# Patient Record
Sex: Female | Born: 1935 | Race: Black or African American | Hispanic: No | Marital: Married | Smoking: Never smoker
Health system: Southern US, Community
[De-identification: ages and names within clinical notes are randomized; demographics above are authoritative.]

## PROBLEM LIST (undated history)

## (undated) DIAGNOSIS — J449 Chronic obstructive pulmonary disease, unspecified: Secondary | ICD-10-CM

## (undated) DIAGNOSIS — E785 Hyperlipidemia, unspecified: Secondary | ICD-10-CM

## (undated) DIAGNOSIS — N289 Disorder of kidney and ureter, unspecified: Secondary | ICD-10-CM

## (undated) DIAGNOSIS — I10 Essential (primary) hypertension: Secondary | ICD-10-CM

## (undated) DIAGNOSIS — E119 Type 2 diabetes mellitus without complications: Secondary | ICD-10-CM

---

## 2004-02-21 ENCOUNTER — Ambulatory Visit: Payer: Self-pay

## 2004-06-20 ENCOUNTER — Ambulatory Visit: Payer: Self-pay | Admitting: Internal Medicine

## 2005-06-26 ENCOUNTER — Ambulatory Visit: Payer: Self-pay | Admitting: Internal Medicine

## 2006-07-02 ENCOUNTER — Ambulatory Visit: Payer: Self-pay | Admitting: Internal Medicine

## 2006-10-18 ENCOUNTER — Emergency Department: Payer: Self-pay | Admitting: Emergency Medicine

## 2006-10-18 ENCOUNTER — Other Ambulatory Visit: Payer: Self-pay

## 2006-10-24 ENCOUNTER — Emergency Department: Payer: Self-pay | Admitting: Emergency Medicine

## 2006-12-22 ENCOUNTER — Emergency Department: Payer: Self-pay | Admitting: Emergency Medicine

## 2007-01-11 ENCOUNTER — Ambulatory Visit: Payer: Self-pay | Admitting: Internal Medicine

## 2007-04-14 ENCOUNTER — Ambulatory Visit: Payer: Self-pay | Admitting: Nephrology

## 2007-05-28 ENCOUNTER — Ambulatory Visit: Payer: Self-pay | Admitting: Nephrology

## 2007-07-28 ENCOUNTER — Ambulatory Visit: Payer: Self-pay | Admitting: Internal Medicine

## 2007-08-26 ENCOUNTER — Ambulatory Visit: Payer: Self-pay | Admitting: Urology

## 2007-09-09 ENCOUNTER — Ambulatory Visit: Payer: Self-pay | Admitting: Nephrology

## 2007-09-17 ENCOUNTER — Ambulatory Visit: Payer: Self-pay | Admitting: Gastroenterology

## 2008-08-01 ENCOUNTER — Ambulatory Visit: Payer: Self-pay | Admitting: Internal Medicine

## 2009-04-04 ENCOUNTER — Ambulatory Visit: Payer: Self-pay | Admitting: Ophthalmology

## 2009-04-16 ENCOUNTER — Ambulatory Visit: Payer: Self-pay | Admitting: Ophthalmology

## 2009-07-04 ENCOUNTER — Ambulatory Visit: Payer: Self-pay | Admitting: Ophthalmology

## 2009-07-16 ENCOUNTER — Ambulatory Visit: Payer: Self-pay | Admitting: Ophthalmology

## 2009-08-02 ENCOUNTER — Ambulatory Visit: Payer: Self-pay | Admitting: Internal Medicine

## 2010-09-03 ENCOUNTER — Ambulatory Visit: Payer: Self-pay | Admitting: Internal Medicine

## 2011-05-09 ENCOUNTER — Ambulatory Visit: Payer: Self-pay | Admitting: Gastroenterology

## 2011-09-24 ENCOUNTER — Ambulatory Visit: Payer: Self-pay | Admitting: Internal Medicine

## 2012-09-24 ENCOUNTER — Ambulatory Visit: Payer: Self-pay | Admitting: Internal Medicine

## 2013-10-12 ENCOUNTER — Ambulatory Visit: Payer: Self-pay | Admitting: Internal Medicine

## 2014-06-27 ENCOUNTER — Ambulatory Visit: Payer: Self-pay | Admitting: Gastroenterology

## 2014-08-21 LAB — SURGICAL PATHOLOGY

## 2014-10-23 ENCOUNTER — Other Ambulatory Visit: Payer: Self-pay | Admitting: Internal Medicine

## 2014-10-23 DIAGNOSIS — Z1231 Encounter for screening mammogram for malignant neoplasm of breast: Secondary | ICD-10-CM

## 2014-10-25 ENCOUNTER — Other Ambulatory Visit: Payer: Self-pay | Admitting: Internal Medicine

## 2014-10-25 ENCOUNTER — Ambulatory Visit
Admission: RE | Admit: 2014-10-25 | Discharge: 2014-10-25 | Disposition: A | Discharge status: Home / Self Care | Payer: Medicare Other | Source: Ambulatory Visit | Attending: Internal Medicine | Admitting: Internal Medicine

## 2014-10-25 DIAGNOSIS — Z1231 Encounter for screening mammogram for malignant neoplasm of breast: Secondary | ICD-10-CM | POA: Diagnosis present

## 2015-03-31 ENCOUNTER — Emergency Department: Payer: Medicare Other

## 2015-03-31 ENCOUNTER — Emergency Department
Admission: EM | Admit: 2015-03-31 | Discharge: 2015-03-31 | Disposition: A | Discharge status: Home / Self Care | Payer: Medicare Other | Attending: Emergency Medicine | Admitting: Emergency Medicine

## 2015-03-31 ENCOUNTER — Encounter: Payer: Self-pay | Admitting: Emergency Medicine

## 2015-03-31 DIAGNOSIS — E119 Type 2 diabetes mellitus without complications: Secondary | ICD-10-CM | POA: Insufficient documentation

## 2015-03-31 DIAGNOSIS — J441 Chronic obstructive pulmonary disease with (acute) exacerbation: Secondary | ICD-10-CM | POA: Diagnosis not present

## 2015-03-31 DIAGNOSIS — N189 Chronic kidney disease, unspecified: Secondary | ICD-10-CM

## 2015-03-31 DIAGNOSIS — R0602 Shortness of breath: Secondary | ICD-10-CM

## 2015-03-31 DIAGNOSIS — I129 Hypertensive chronic kidney disease with stage 1 through stage 4 chronic kidney disease, or unspecified chronic kidney disease: Secondary | ICD-10-CM | POA: Insufficient documentation

## 2015-03-31 HISTORY — DX: Chronic obstructive pulmonary disease, unspecified: J44.9

## 2015-03-31 HISTORY — DX: Hyperlipidemia, unspecified: E78.5

## 2015-03-31 HISTORY — DX: Type 2 diabetes mellitus without complications: E11.9

## 2015-03-31 HISTORY — DX: Disorder of kidney and ureter, unspecified: N28.9

## 2015-03-31 HISTORY — DX: Essential (primary) hypertension: I10

## 2015-03-31 LAB — BASIC METABOLIC PANEL
Anion gap: 11 (ref 5–15)
BUN: 76 mg/dL — ABNORMAL HIGH (ref 6–20)
CO2: 24 mmol/L (ref 22–32)
Calcium: 9.6 mg/dL (ref 8.9–10.3)
Chloride: 104 mmol/L (ref 101–111)
Creatinine, Ser: 2.15 mg/dL — ABNORMAL HIGH (ref 0.44–1.00)
GFR calc Af Amer: 24 mL/min — ABNORMAL LOW (ref >60)
GFR calc non Af Amer: 21 mL/min — ABNORMAL LOW (ref >60)
Glucose, Bld: 99 mg/dL (ref 65–99)
Potassium: 4.2 mmol/L (ref 3.5–5.1)
Sodium: 139 mmol/L (ref 135–145)

## 2015-03-31 LAB — CBC WITH DIFFERENTIAL/PLATELET
Basophils Absolute: 0 10*3/uL (ref 0–0.1)
Basophils Relative: 1 %
EOS ABS: 0 10*3/uL (ref 0–0.7)
Eosinophils Relative: 1 %
HEMATOCRIT: 33.3 % — AB (ref 35.0–47.0)
HEMOGLOBIN: 10.9 g/dL — AB (ref 12.0–16.0)
Lymphocytes Relative: 23 %
Lymphs Abs: 1.2 10*3/uL (ref 1.0–3.6)
MCH: 26.6 pg (ref 26.0–34.0)
MCHC: 32.7 g/dL (ref 32.0–36.0)
MCV: 81.4 fL (ref 80.0–100.0)
Monocytes Absolute: 0.5 10*3/uL (ref 0.2–0.9)
Monocytes Relative: 9 %
NEUTROS ABS: 3.4 10*3/uL (ref 1.4–6.5)
NEUTROS PCT: 66 %
Platelets: 227 10*3/uL (ref 150–440)
RBC: 4.1 MIL/uL (ref 3.80–5.20)
RDW: 14.9 % — ABNORMAL HIGH (ref 11.5–14.5)
WBC: 5.1 10*3/uL (ref 3.6–11.0)

## 2015-03-31 LAB — TROPONIN I: Troponin I: <0.03 ng/mL (ref <0.031)

## 2015-03-31 MED ORDER — SODIUM CHLORIDE 0.9 % IV BOLUS (SEPSIS)
1000.0000 mL | Freq: Once | INTRAVENOUS | Status: AC
  Administered 2015-03-31: 1000 mL via INTRAVENOUS

## 2015-03-31 NOTE — Discharge Instructions (Signed)
Your chest x-ray looked okay in your blood tests look about the same as they usually are. You appeared well in the emergency department. Follow-up with Dr. Judithann SheenSparks this week. This was discussed with him as well. Return the emergency department if you have worsening shortness of breath or other urgent concerns.  Shortness of Breath Shortness of breath means you have trouble breathing. It could also mean that you have a medical problem. You should get immediate medical care for shortness of breath. CAUSES   Not enough oxygen in the air such as with high altitudes or a smoke-filled room.  Certain lung diseases, infections, or problems.  Heart disease or conditions, such as angina or heart failure.  Low red blood cells (anemia).  Poor physical fitness, which can cause shortness of breath when you exercise.  Chest or back injuries or stiffness.  Being overweight.  Smoking.  Anxiety, which can make you feel like you are not getting enough air. DIAGNOSIS  Serious medical problems can often be found during your physical exam. Tests may also be done to determine why you are having shortness of breath. Tests may include:  Chest X-rays.  Lung function tests.  Blood tests.  An electrocardiogram (ECG).  An ambulatory electrocardiogram. An ambulatory ECG records your heartbeat patterns over a 24-hour period.  Exercise testing.  A transthoracic echocardiogram (TTE). During echocardiography, sound waves are used to evaluate how blood flows through your heart.  A transesophageal echocardiogram (TEE).  Imaging scans. Your health care provider may not be able to find a cause for your shortness of breath after your exam. In this case, it is important to have a follow-up exam with your health care provider as directed.  TREATMENT  Treatment for shortness of breath depends on the cause of your symptoms and can vary greatly. HOME CARE INSTRUCTIONS   Do not smoke. Smoking is a common cause  of shortness of breath. If you smoke, ask for help to quit.  Avoid being around chemicals or things that may bother your breathing, such as paint fumes and dust.  Rest as needed. Slowly resume your usual activities.  If medicines were prescribed, take them as directed for the full length of time directed. This includes oxygen and any inhaled medicines.  Keep all follow-up appointments as directed by your health care provider. SEEK MEDICAL CARE IF:   Your condition does not improve in the time expected.  You have a hard time doing your normal activities even with rest.  You have any new symptoms. SEEK IMMEDIATE MEDICAL CARE IF:   Your shortness of breath gets worse.  You feel light-headed, faint, or develop a cough not controlled with medicines.  You start coughing up blood.  You have pain with breathing.  You have chest pain or pain in your arms, shoulders, or abdomen.  You have a fever.  You are unable to walk up stairs or exercise the way you normally do. MAKE SURE YOU:  Understand these instructions.  Will watch your condition.  Will get help right away if you are not doing well or get worse.   This information is not intended to replace advice given to you by your health care provider. Make sure you discuss any questions you have with your health care provider.   Document Released: 01/07/2001 Document Revised: 04/19/2013 Document Reviewed: 06/30/2011 Elsevier Interactive Patient Education [employer]2016 Elsevier Inc.

## 2015-03-31 NOTE — ED Notes (Signed)
Pt to ed with c/o sob increasing over the last 2 weeks.  Pt denies chest pain. Pt states sob is worse with activity.

## 2015-03-31 NOTE — ED Provider Notes (Signed)
[facility] Emergency Department Provider Note  ____________________________________________  Time seen: 1415  I have reviewed the triage vital signs and the nursing notes.  History by:  Patient  HISTORY  Chief Complaint Shortness of Breath     HPI Debbie Patrick is a 81 y.o. female who presents to the emergency department with complaint of shortness of breath. She tells me she first noticed this about 3 weeks ago when she went to the tract to do her usual walk. She felt that she was a little short winded and stopped her walker early. This reoccurred over the next 2 days. Since then, she reports she's had short episodes with some shortness of breath, although she does not seem to be alarmed by it or particular concerned. She is a little bit tangential with the history and she does not describe any specific acute event other than the episodes at the track approximately 3 weeks ago. She does report that she had a little bit of shortness of breath earlier today and she told this to her daughter-in-law on the telephone. The daughter-in-law said she needed to go to the hospital and the daughter-in-law also told this to the patient's husband. They are now here for evaluation, in large part due to the daughter-in-law's concern.  The patient denies any chest pain. As noted above, she is vague about her symptoms and when they're at their worst. She reports she had approximately a 20 minute episode earlier today of some shortness of breath, though she does not appear to be alarmed or concerned with that.   Past Medical History  Diagnosis Date  . Diabetes mellitus without complication (HCC)   . Hypertension   . Renal disorder   . COPD (chronic obstructive pulmonary disease) (HCC)   . Hyperlipidemia     There are no active problems to display for this patient.   History reviewed. No pertinent past surgical history.  No current outpatient prescriptions on  file.  Allergies Review of patient's allergies indicates no known allergies.  History reviewed. No pertinent family history.  Social History Social History  Substance Use Topics  . Smoking status: Never Smoker   . Smokeless tobacco: None  . Alcohol Use: No    Review of Systems  Constitutional: Negative for fever/chills. ENT: Negative for congestion. Cardiovascular: Negative for chest pain. Respiratory: Patient reports some shortness of breath, didn't abate manner. See history of present illness Gastrointestinal: Negative for abdominal pain, vomiting and diarrhea. Genitourinary: Negative for dysuria. Musculoskeletal: No back pain. Skin: Negative for rash. Neurological: Negative for headache or focal weakness   10-point ROS otherwise negative.  ____________________________________________   PHYSICAL EXAM:  VITAL SIGNS: ED Triage Vitals  Enc Vitals Group     BP 03/31/15 1357 200/64 mmHg     Pulse Rate 03/31/15 1357 80     Resp 03/31/15 1357 20     Temp 03/31/15 1357 98 F (36.7 C)     Temp Source 03/31/15 1357 Oral     SpO2 03/31/15 1357 97 %     Weight 03/31/15 1357 104 lb (47.174 kg)     Height 03/31/15 1357 5' (1.524 m)     Head Cir --      Peak Flow --      Pain Score 03/31/15 1358 0     Pain Loc --      Pain Edu? --      Excl. in GC? --     Constitutional: Alert and communicative. Very pleasant  and cooperative. Little vague on details of her symptoms, but appears to have intact cognition. No distress. ENT   Head: Normocephalic and atraumatic.   Nose: No congestion/rhinnorhea.  Cardiovascular: Normal rate, regular rhythm, no murmur noted Respiratory:  Normal respiratory effort, no tachypnea.    Breath sounds are clear and equal bilaterally.  Gastrointestinal: Soft, no distention. Nontender Back: No muscle spasm, no tenderness, no CVA tenderness. Musculoskeletal: No deformity noted. Nontender with normal range of motion in all extremities.  No  noted edema. Neurologic:  Communicative. Normal appearing spontaneous movement in all 4 extremities. No gross focal neurologic deficits are appreciated.  Skin:  Skin is warm, dry. No rash noted. Psychiatric: Mood and affect are normal. Speech and behavior are normal.  ____________________________________________    LABS (pertinent positives/negatives)  Labs Reviewed  BASIC METABOLIC PANEL - Abnormal; Notable for the following:    BUN 76 (*)    Creatinine, Ser 2.15 (*)    GFR calc non Af Amer 21 (*)    GFR calc Af Amer 24 (*)    All other components within normal limits  CBC WITH DIFFERENTIAL/PLATELET - Abnormal; Notable for the following:    Hemoglobin 10.9 (*)    HCT 33.3 (*)    RDW 14.9 (*)    All other components within normal limits  TROPONIN I     ____________________________________________   EKG  ED ECG REPORT I, Victorhugo Preis W, the attending physician, personally viewed and interpreted this ECG.   Date: 03/31/2015  EKG Time: 1416  Rate: 72  Rhythm:  Normal sinus rhythm   Axis: Normal  Intervals: Normal  ST&T Change: None noted   ____________________________________________    RADIOLOGY  Chest x-ray:  IMPRESSION: 1. Cardiomegaly, mild to moderate in degree. 2. Lungs are clear and there is no evidence of acute cardiopulmonary abnormality. 3. Scoliosis, moderate to severe in degree.   ____________________________________________   PROCEDURES   ____________________________________________   INITIAL IMPRESSION / ASSESSMENT AND PLAN / ED COURSE  Pertinent labs & imaging results that were available during my care of the patient were reviewed by me and considered in my medical decision making (see chart for details).  Pleasant, well-appearing, 81 year old female in no acute distress. She reports having some increased shortness of breath when she goes for her daily walk. This is limited her from pursuing this, however she has not had any other  limitation in daily activities. She reports she stays busy around the house cleaning and did some ironing this morning without having shortness of breath, though she does describe a 20 minute episode while she was sitting down. She denies any chest pain. Her exam appears benign. We will get a chest x-ray and check a blood count and EKG to be sure that this 81 year old female is doing well.  ----------------------------------------- 4:26 PM on 03/31/2015 -----------------------------------------  The patient's chest x-ray is clear. Her blood count is 10.9, but this is consistent with previous levels. Her BUN is notably elevated at 76, but again this is not abnormal for her prior lab values.  Given the patient's overall well appearance and baseline lab values, we will discharge her home and advised her to follow with her primary physician. I've asked her to return to the emergency department if she has worsening shortness of breath or any urgent concerns.  ____________________________________________   FINAL CLINICAL IMPRESSION(S) / ED DIAGNOSES  Final diagnoses:  Shortness of breath  Chronic renal insufficiency, unspecified stage      Darien Ramusavid W Jeramy Dimmick, MD  03/31/15 1634 

## 2015-04-04 ENCOUNTER — Other Ambulatory Visit: Payer: Self-pay | Admitting: Internal Medicine

## 2015-04-04 DIAGNOSIS — R05 Cough: Secondary | ICD-10-CM

## 2015-04-04 DIAGNOSIS — R0609 Other forms of dyspnea: Secondary | ICD-10-CM

## 2015-04-04 DIAGNOSIS — R059 Cough, unspecified: Secondary | ICD-10-CM

## 2015-04-13 ENCOUNTER — Ambulatory Visit
Admission: RE | Admit: 2015-04-13 | Discharge: 2015-04-13 | Disposition: A | Discharge status: Home / Self Care | Payer: Medicare Other | Source: Ambulatory Visit | Attending: Internal Medicine | Admitting: Internal Medicine

## 2015-04-13 DIAGNOSIS — N261 Atrophy of kidney (terminal): Secondary | ICD-10-CM | POA: Diagnosis not present

## 2015-04-13 DIAGNOSIS — R0609 Other forms of dyspnea: Secondary | ICD-10-CM

## 2015-04-13 DIAGNOSIS — R05 Cough: Secondary | ICD-10-CM | POA: Diagnosis present

## 2015-04-13 DIAGNOSIS — R918 Other nonspecific abnormal finding of lung field: Secondary | ICD-10-CM | POA: Diagnosis not present

## 2015-04-13 DIAGNOSIS — R059 Cough, unspecified: Secondary | ICD-10-CM

## 2015-04-13 DIAGNOSIS — N281 Cyst of kidney, acquired: Secondary | ICD-10-CM | POA: Diagnosis not present

## 2015-08-22 ENCOUNTER — Other Ambulatory Visit: Payer: Self-pay | Admitting: Internal Medicine

## 2015-08-22 DIAGNOSIS — R4182 Altered mental status, unspecified: Secondary | ICD-10-CM

## 2015-08-29 ENCOUNTER — Ambulatory Visit
Admission: RE | Admit: 2015-08-29 | Discharge: 2015-08-29 | Disposition: A | Discharge status: Home / Self Care | Payer: Medicare Other | Source: Ambulatory Visit | Attending: Internal Medicine | Admitting: Internal Medicine

## 2015-08-29 DIAGNOSIS — I6782 Cerebral ischemia: Secondary | ICD-10-CM | POA: Diagnosis not present

## 2015-08-29 DIAGNOSIS — J349 Unspecified disorder of nose and nasal sinuses: Secondary | ICD-10-CM | POA: Insufficient documentation

## 2015-08-29 DIAGNOSIS — R4182 Altered mental status, unspecified: Secondary | ICD-10-CM | POA: Diagnosis present

## 2016-01-30 ENCOUNTER — Inpatient Hospital Stay
Admission: EM | Admit: 2016-01-30 | Discharge: 2016-02-27 | DRG: 082 | Disposition: E | Payer: Medicare Other | Attending: Internal Medicine | Admitting: Internal Medicine

## 2016-01-30 ENCOUNTER — Emergency Department: Payer: Medicare Other

## 2016-01-30 DIAGNOSIS — Z681 Body mass index (BMI) 19 or less, adult: Secondary | ICD-10-CM | POA: Diagnosis not present

## 2016-01-30 DIAGNOSIS — G935 Compression of brain: Secondary | ICD-10-CM

## 2016-01-30 DIAGNOSIS — S06359A Traumatic hemorrhage of left cerebrum with loss of consciousness of unspecified duration, initial encounter: Principal | ICD-10-CM | POA: Diagnosis present

## 2016-01-30 DIAGNOSIS — E119 Type 2 diabetes mellitus without complications: Secondary | ICD-10-CM | POA: Diagnosis present

## 2016-01-30 DIAGNOSIS — R402212 Coma scale, best verbal response, none, at arrival to emergency department: Secondary | ICD-10-CM | POA: Diagnosis present

## 2016-01-30 DIAGNOSIS — E785 Hyperlipidemia, unspecified: Secondary | ICD-10-CM | POA: Diagnosis present

## 2016-01-30 DIAGNOSIS — J449 Chronic obstructive pulmonary disease, unspecified: Secondary | ICD-10-CM | POA: Diagnosis present

## 2016-01-30 DIAGNOSIS — W19XXXA Unspecified fall, initial encounter: Secondary | ICD-10-CM | POA: Diagnosis present

## 2016-01-30 DIAGNOSIS — J9601 Acute respiratory failure with hypoxia: Secondary | ICD-10-CM | POA: Diagnosis present

## 2016-01-30 DIAGNOSIS — Z66 Do not resuscitate: Secondary | ICD-10-CM | POA: Diagnosis present

## 2016-01-30 DIAGNOSIS — I10 Essential (primary) hypertension: Secondary | ICD-10-CM | POA: Diagnosis present

## 2016-01-30 DIAGNOSIS — R402112 Coma scale, eyes open, never, at arrival to emergency department: Secondary | ICD-10-CM | POA: Diagnosis present

## 2016-01-30 DIAGNOSIS — I619 Nontraumatic intracerebral hemorrhage, unspecified: Secondary | ICD-10-CM | POA: Diagnosis present

## 2016-01-30 DIAGNOSIS — Z515 Encounter for palliative care: Secondary | ICD-10-CM | POA: Diagnosis not present

## 2016-01-30 DIAGNOSIS — I629 Nontraumatic intracranial hemorrhage, unspecified: Secondary | ICD-10-CM | POA: Diagnosis present

## 2016-01-30 DIAGNOSIS — I618 Other nontraumatic intracerebral hemorrhage: Secondary | ICD-10-CM

## 2016-01-30 DIAGNOSIS — G936 Cerebral edema: Secondary | ICD-10-CM | POA: Diagnosis present

## 2016-01-30 DIAGNOSIS — M4312 Spondylolisthesis, cervical region: Secondary | ICD-10-CM | POA: Diagnosis present

## 2016-01-30 DIAGNOSIS — R402312 Coma scale, best motor response, none, at arrival to emergency department: Secondary | ICD-10-CM | POA: Diagnosis present

## 2016-01-30 LAB — BLOOD GAS, ARTERIAL
ALLENS TEST (PASS/FAIL): POSITIVE — AB
Acid-base deficit: 7.9 mmol/L — ABNORMAL HIGH (ref 0.0–2.0)
BICARBONATE: 21 mmol/L (ref 20.0–28.0)
FIO2: 0.4
LHR: 14 {breaths}/min
MECHANICAL RATE: 14
MECHVT: 400 mL
O2 Saturation: 93.7 %
PATIENT TEMPERATURE: 37
PCO2 ART: 59 mmHg — AB (ref 32.0–48.0)
PEEP: 5 cmH2O
PO2 ART: 88 mmHg (ref 83.0–108.0)
pH, Arterial: 7.16 — CL (ref 7.350–7.450)

## 2016-01-30 LAB — CBC
HCT: 36 % (ref 35.0–47.0)
Hemoglobin: 12.1 g/dL (ref 12.0–16.0)
MCH: 29.1 pg (ref 26.0–34.0)
MCHC: 33.5 g/dL (ref 32.0–36.0)
MCV: 86.8 fL (ref 80.0–100.0)
PLATELETS: 187 10*3/uL (ref 150–440)
RBC: 4.15 MIL/uL (ref 3.80–5.20)
RDW: 14.6 % — AB (ref 11.5–14.5)
WBC: 14.5 10*3/uL — AB (ref 3.6–11.0)

## 2016-01-30 LAB — COMPREHENSIVE METABOLIC PANEL
ALBUMIN: 3.7 g/dL (ref 3.5–5.0)
ALT: 20 U/L (ref 14–54)
ANION GAP: 8 (ref 5–15)
AST: 30 U/L (ref 15–41)
Alkaline Phosphatase: 83 U/L (ref 38–126)
BUN: 85 mg/dL — AB (ref 6–20)
CHLORIDE: 109 mmol/L (ref 101–111)
CO2: 22 mmol/L (ref 22–32)
Calcium: 9.3 mg/dL (ref 8.9–10.3)
Creatinine, Ser: 3.22 mg/dL — ABNORMAL HIGH (ref 0.44–1.00)
GFR calc Af Amer: 14 mL/min — ABNORMAL LOW (ref >60)
GFR calc non Af Amer: 13 mL/min — ABNORMAL LOW (ref >60)
GLUCOSE: 265 mg/dL — AB (ref 65–99)
POTASSIUM: 4.2 mmol/L (ref 3.5–5.1)
SODIUM: 139 mmol/L (ref 135–145)
Total Bilirubin: 0.5 mg/dL (ref 0.3–1.2)
Total Protein: 6.5 g/dL (ref 6.5–8.1)

## 2016-01-30 LAB — GLUCOSE, CAPILLARY: Glucose-Capillary: 304 mg/dL — ABNORMAL HIGH (ref 65–99)

## 2016-01-30 LAB — URINALYSIS COMPLETE WITH MICROSCOPIC (ARMC ONLY)
BILIRUBIN URINE: NEGATIVE
GLUCOSE, UA: 50 mg/dL — AB
HGB URINE DIPSTICK: NEGATIVE
KETONES UR: NEGATIVE mg/dL
LEUKOCYTES UA: NEGATIVE
Nitrite: NEGATIVE
PH: 6 (ref 5.0–8.0)
Protein, ur: 100 mg/dL — AB
RBC / HPF: NONE SEEN RBC/hpf (ref 0–5)
SPECIFIC GRAVITY, URINE: 1.008 (ref 1.005–1.030)
SQUAMOUS EPITHELIAL / LPF: NONE SEEN

## 2016-01-30 LAB — LACTIC ACID, PLASMA
LACTIC ACID, VENOUS: 2 mmol/L — AB (ref 0.5–1.9)
Lactic Acid, Venous: 2.4 mmol/L (ref 0.5–1.9)

## 2016-01-30 LAB — TROPONIN I: TROPONIN I: 0.04 ng/mL — AB (ref <0.03)

## 2016-01-30 MED ORDER — SODIUM CHLORIDE 0.9 % IV BOLUS (SEPSIS)
1000.0000 mL | Freq: Once | INTRAVENOUS | Status: AC
  Administered 2016-01-30: 1000 mL via INTRAVENOUS

## 2016-01-30 MED ORDER — MORPHINE SULFATE (PF) 4 MG/ML IV SOLN: 4.0000 mg | INTRAVENOUS | Status: DC | PRN

## 2016-01-30 MED ORDER — FENTANYL CITRATE (PF) 100 MCG/2ML IJ SOLN: 50.0000 ug | INTRAMUSCULAR | Status: DC | PRN

## 2016-01-30 MED ORDER — MORPHINE SULFATE (PF) 4 MG/ML IV SOLN
INTRAVENOUS | Status: AC
  Administered 2016-01-30: 4 mg
  Filled 2016-01-30: qty 1

## 2016-01-30 MED ORDER — ROCURONIUM BROMIDE 50 MG/5ML IV SOLN
70.0000 mg | Freq: Once | INTRAVENOUS | Status: AC
  Administered 2016-01-30: 70 mg via INTRAVENOUS

## 2016-01-30 MED ORDER — ETOMIDATE 2 MG/ML IV SOLN
15.0000 mg | Freq: Once | INTRAVENOUS | Status: AC
  Administered 2016-01-30: 15 mg via INTRAVENOUS

## 2016-01-30 MED ORDER — MIDAZOLAM HCL 2 MG/2ML IJ SOLN: 1.0000 mg | INTRAMUSCULAR | Status: DC | PRN

## 2016-01-30 MED ORDER — HYDRALAZINE HCL 20 MG/ML IJ SOLN
10.0000 mg | INTRAMUSCULAR | Status: DC | PRN
Start: 2016-01-30 — End: 2016-01-30

## 2016-01-30 MED ORDER — SODIUM CHLORIDE 0.9 % IV SOLN: 250.0000 mL | INTRAVENOUS | Status: DC | PRN

## 2016-02-27 NOTE — Progress Notes (Signed)
Pharmacy consult note Antibiotic renal dosing Patient is not currently on any antibiotics that require renal adjustment. Pharmacy will follow and manage as appropriate.

## 2016-02-27 NOTE — ED Triage Notes (Signed)
Pt arrives to ED via [facility] from home at 0400. EMS states pt had a syncopal episode with head injury (unknown at what time episode/injury actually occurred), and went to sleep soon afterwards. EMS states pt's husband noted her to be "snoring funny" and he was unable to awake her or obtain a response. Husband also reports pt was covered with emesis. EMS states pt possible aspirated vomitus, had equal, but non-reactive pupils, and was very minimally (if at all) responsive to painful stimuli.

## 2016-02-27 NOTE — Discharge Summary (Signed)
DEATH SUMMARY  Name: Debbie Patrick MRN:   [id number] DOB:   12/19/1932             ADMISSION DATE:  01/28/2016 CONSULTATION DATE:  02/23/2016  REFERRING MD:  Dr. Zenda AlpersWebster  CHIEF COMPLAINT:   ICH ADMITTING/Discharge DX: ICH Hosp Course-Vent support  Imaging Ct Head Wo Contrast  STUDIES:  10/4 CT head>>CT HEAD: Acute large LEFT frontal intraparenchymal hematomaresulting in 2 cm of LEFT-to-RIGHT subfalcine herniation. RIGHTventricular entrapment.   SIGNIFICANT EVENTS: 10/4 >patient admitted with non survival brain bleed. Awaiting family members.   Patient with extensive Brain injury from ICH   After Further discussion with Family-Husband and Son, the family has agreed and consented to DNR status and have decided to proceed with Comfort Care measures  Order Set placed. Patient was extubated, and pronounced dead at 834AM    Lucie LeatherKurian David Takira Sherrin, M.D.  [facility]  Medical Director [facility] Medical Director [facility] Cardio[facility] Department

## 2016-02-27 NOTE — ED Notes (Signed)
Family at bedside. 

## 2016-02-27 NOTE — Progress Notes (Signed)
eLink Physician-Brief Progress Note Patient Name: Debbie MaxinCarlene Patrick DOB: 07/27/1932 MRN: [id number]   Date of Service  02/17/2016  HPI/Events of Note  82 yo with ICH and 2cm L>R shift with edema  eICU Interventions  Admit to ICU Husband awaiting son arrival before instituting comfort care measures and DNR status Maintain SBP 160-180 PRN fentanyl/versed On MV     Intervention Category Evaluation Type: New Patient Evaluation  [name] 02/09/2016, 5:29 AM

## 2016-02-27 NOTE — ED Provider Notes (Signed)
[facility] Emergency Department Provider Note   ____________________________________________   First MD Initiated Contact with Patient 01/31/16 0406     (approximate)  I have reviewed the triage vital signs and the nursing notes.   HISTORY  Chief Complaint Respiratory Arrest  Patient unresponsive and unable to participate in history  HPI Debbie Patrick is a 82 y.o. female who was brought in by EMS with unresponsiveness. According to EMS the patient had gotten up to go to the bathroom at some time in the middle of the night. The patient's husband thinks she may have fallen but he is unsure. The patient came back to bed and went to sleep. The patient's husband then woke up later in the evening after hearing the patient breathing funny. He reports he noticed she had vomited. She tried to wake her up but the patient was unresponsive. The patient's husband called EMS to bring her into the hospital. Per EMS the patient was clenching her teeth initially but not responsive. She is here for evaluation.   Past Medical History:  Diagnosis Date  . COPD (chronic obstructive pulmonary disease) (HCC)   . Diabetes mellitus without complication (HCC)   . Hyperlipidemia   . Hypertension   . Renal disorder     Patient Active Problem List   Diagnosis Date Noted  . ICH (intracerebral hemorrhage) (HCC) 2016-01-31    History reviewed. No pertinent surgical history.  Prior to Admission medications   Not on File    Allergies Review of patient's allergies indicates no known allergies.  No family history on file.  Social History Social History  Substance Use Topics  . Smoking status: Never Smoker  . Smokeless tobacco: Never Used  . Alcohol use No    Review of Systems  Gastrointestinal: Vomiting Genitourinary: Negative for dysuria.  Unable to obtain a full review of systems due to patient  unresponsiveness  ____________________________________________   PHYSICAL EXAM:  VITAL SIGNS: ED Triage Vitals  Enc Vitals Group     BP 01-31-16 0403 (!) 218/107     Pulse Rate January 31, 2016 0403 91     Resp 01/31/2016 0403 15     Temp 01/31/16 0403 97 F (36.1 C)     Temp Source 01-31-16 0403 Rectal     SpO2 01-31-2016 0403 99 %     Weight 01/31/16 0417 104 lb (47.2 kg)     Height 01-31-16 0417 5\' 2"  (1.575 m)     Head Circumference --      Peak Flow --      Pain Score --      Pain Loc --      Pain Edu? --      Excl. in GC? --     Constitutional: Unresponsive Eyes: Conjunctivae are normal. PERRL. proximally 3 mm bilaterally Head: Atraumatic. Nose: No congestion/rhinnorhea. Mouth/Throat: Mucous membranes are moist.  Oropharynx non-erythematous. Cardiovascular: Normal rate, regular rhythm. Grossly normal heart sounds.  Good peripheral circulation. Respiratory: Increased respiratory effort.  No retractions. Expiratory wheezes in all lung fields Gastrointestinal: Soft and nontender. No distention. Musculoskeletal: No lower extremity tenderness nor edema.   Neurologic:  Patient not responsive with a GCS of 3, no response to painful stimuli. Skin:  Skin is warm, dry and intact.  Psychiatric: Mood and affect are normal.   ____________________________________________   LABS (all labs ordered are listed, but only abnormal results are displayed)  Labs Reviewed  CBC - Abnormal; Notable for the following:  Result Value   WBC 14.5 (*)    RDW 14.6 (*)    All other components within normal limits  COMPREHENSIVE METABOLIC PANEL - Abnormal; Notable for the following:    Glucose, Bld 265 (*)    BUN 85 (*)    Creatinine, Ser 3.22 (*)    GFR calc non Af Amer 13 (*)    GFR calc Af Amer 14 (*)    All other components within normal limits  TROPONIN I - Abnormal; Notable for the following:    Troponin I 0.04 (*)    All other components within normal limits  LACTIC ACID, PLASMA -  Abnormal; Notable for the following:    Lactic Acid, Venous 2.4 (*)    All other components within normal limits  LACTIC ACID, PLASMA - Abnormal; Notable for the following:    Lactic Acid, Venous 2.0 (*)    All other components within normal limits  URINALYSIS COMPLETEWITH MICROSCOPIC ([facility] ONLY) - Abnormal; Notable for the following:    Color, Urine STRAW (*)    APPearance CLEAR (*)    Glucose, UA 50 (*)    Protein, ur 100 (*)    Bacteria, UA RARE (*)    All other components within normal limits  BLOOD GAS, ARTERIAL - Abnormal; Notable for the following:    pH, Arterial 7.16 (*)    pCO2 arterial 59 (*)    Acid-base deficit 7.9 (*)    Allens test (pass/fail) POSITIVE (*)    All other components within normal limits  GLUCOSE, CAPILLARY - Abnormal; Notable for the following:    Glucose-Capillary 304 (*)    All other components within normal limits   ____________________________________________  EKG  ED ECG REPORT I, Rebecka Apley, the attending physician, personally viewed and interpreted this ECG.   Date: February 17, 2016  EKG Time: 407  Rate: 120  Rhythm: sinus tachycardia  Axis: normal  Intervals:none  ST&T Change: ST depressions in leads 23 and aVF as well as in leads V4, V5 and V6.  ____________________________________________  RADIOLOGY  CT head CT cervical spine CXR ____________________________________________   PROCEDURES  Procedure(s) performed: please, see procedure note(s).  .Intubation Date/Time: 17-Feb-2016 4:33 AM Performed by: Rebecka Apley Authorized by: Rebecka Apley   Consent:    Consent obtained:  Emergent situation Pre-procedure details:    Patient status:  Unresponsive   Paralytics:  [place] Procedure details:    Preoxygenation:  Bag valve mask   CPR in progress: no     Intubation method:  Oral   Oral intubation technique:  Video-assisted   Laryngoscope blade:  Mac 4   Tube size (mm):  7.5   Tube type:  Cuffed    Number of attempts:  1   Ventilation between attempts: no     Cricoid pressure: no     Tube visualized through cords: yes   Placement assessment:    ETT to lip:  20   Tube secured with:  ETT holder   Breath sounds:  Equal and absent over the epigastrium   Placement verification: chest rise, condensation, direct visualization and ETCO2 detector     CXR findings:  ETT in proper place Post-procedure details:    Patient tolerance of procedure:  Tolerated well, no immediate complications    Critical Care performed: Yes, see critical care note(s)  CRITICAL CARE Performed by: Lucrezia Europe P   Total critical care time: 30 minutes  Critical care time was exclusive of separately billable procedures  and treating other patients.  Critical care was necessary to treat or prevent imminent or life-threatening deterioration.  Critical care was time spent personally by me on the following activities: development of treatment plan with patient and/or surrogate as well as nursing, discussions with consultants, evaluation of patient's response to treatment, examination of patient, obtaining history from patient or surrogate, ordering and performing treatments and interventions, ordering and review of laboratory studies, ordering and review of radiographic studies, pulse oximetry and re-evaluation of patient's condition.  ____________________________________________   INITIAL IMPRESSION / ASSESSMENT AND PLAN / ED COURSE  Pertinent labs & imaging results that were available during my care of the patient were reviewed by me and considered in my medical decision making (see chart for details).  This is an 82 year old female who comes into the hospital today with unresponsiveness after a fall. The patient was also vomiting. I did have to intubate the patient as she was having some labored breathing and had a lot of emesis in her mouth. The patient appears to have aspirated as well. I will send the  patient for a CT scan of her head as well as her cervical spine and a chest x-ray. My concern with the patient's unresponsiveness is that she may have had an intracranial hemorrhage. The patient will receive some blood work as well as imaging studies and she will be reassessed once I received the results.  Clinical Course   CT head and cervical spine:  Acute large LEFT frontal intraparenchymal hematoma resulting in 2 cm of LEFT-to-RIGHT subfalcine herniation. RIGHT ventricular entrapment.  Small LEFT cerebellar tentorium subdural hematoma versus dural venous sinus congestion.  CT CERVICAL SPINE: No acute fracture. Grade 1 C3-4 anterolisthesis on degenerative basis.  CXR: Endotracheal tube tip measures 2.5 cm above the carina. Emphysematous changes in the lungs. No evidence of active pulmonary disease  I [facility] the patient[facility]. Looking at the patient's CT scan we do not feel that this bleed is survivable. The patient already has shift with herniation. The patient also has ventricular entrapment and she is unresponsive. We feel that the patient should be made comfort care in the hospital. I discussed this with the patient's husband and he reports that he is waiting for his son and does not want to make any decisions until his son arrives. I will admit the patient to the intensive care unit service as she is intubated and not yet DO NOT RESUSCITATE. Did attempt to admit the patient to stepdown to eventually be made comfort care but the hospitalist felt it more appropriate for the patient to be on the intensivist service. The patient will be admitted and further evaluated in the ICU. ____________________________________________   FINAL CLINICAL IMPRESSION(S) / ED DIAGNOSES  Final diagnoses:  Intracranial hemorrhage (HCC)  Herniation of brain stem (HCC)      NEW MEDICATIONS STARTED DURING THIS VISIT:  There are no discharge medications  for this patient.    Note:  This document was prepared using Dragon voice recognition software and may include unintentional dictation errors.    Rebecka ApleyAllison P Keyundra Fant, MD 04-11-2016 [phone]

## 2016-02-27 NOTE — Consult Note (Signed)
Chaplain was called to be present and pray with family at the end of life. Multiple prayers said.

## 2016-02-27 NOTE — H&P (Signed)
PULMONARY / CRITICAL CARE MEDICINE   Name: Debbie Patrick MRN: [id number] DOB: 29-Mar-1933    ADMISSION DATE:  02-12-16 CONSULTATION DATE:  2016-02-12  REFERRING MD:  Dr. Zenda Alpers  CHIEF COMPLAINT:   ICH  HISTORY OF PRESENT ILLNESS:   Debbie Patrick is an 82 yo female with Hx of COPD, Diabetes, hypertension and Hyperlipidemia.  Patient was brought to the ED via EMS after a syncopal episode with head injury. Husband is not aware of the time of injury.  She went to sleep soon after the injury and he was unable to wake her up.  She was also noted to be "snoring funny" and covered with emesis.  In the ED CT of head was concerning for large left frontal interparenchymal hematoma resulting in 2cm of left  To right subfalcine herniation.  Husband is made aware of the patient's non survival brain bleed . Awaiting other family members to get here before placing comfort care orders.  PAST MEDICAL HISTORY :  She  has a past medical history of COPD (chronic obstructive pulmonary disease) (HCC); Diabetes mellitus without complication (HCC); Hyperlipidemia; Hypertension; and Renal disorder.  PAST SURGICAL HISTORY: She  has no past surgical history on file.  No Known Allergies  No current facility-administered medications on file prior to encounter.    No current outpatient prescriptions on file prior to encounter.    FAMILY HISTORY:  Her indicated that her mother is deceased. She indicated that her father is deceased.    SOCIAL HISTORY: She  reports that she has never smoked. She has never used smokeless tobacco. She reports that she does not drink alcohol or use drugs.  REVIEW OF SYSTEMS:   Unable to obtain  SUBJECTIVE:  Unable to obtain  VITAL SIGNS: BP (!) 245/109   Pulse (!) 103   Temp 97 F (36.1 C) (Rectal)   Resp 18   Ht 5\' 2"  (1.575 m)   Wt 47.2 kg (104 lb)   SpO2 97%   BMI 19.02 kg/m   HEMODYNAMICS:    VENTILATOR SETTINGS: Vent Mode: AC FiO2 (%):  [40 %] 40 % Set  Rate:  [14 bmp-18 bmp] 18 bmp Vt Set:  [400 mL] 400 mL PEEP:  [5 cmH20] 5 cmH20  INTAKE / OUTPUT: No intake/output data recorded.  PHYSICAL EXAMINATION: General: elderly sickly appearing female Neuro:obtunded HEENT:  nonreactive pupils Cardiovascular:  Tachycardic, regular Lungs:  Clear bilaterally Abdomen:  Soft, nontender Musculoskeletal: no inflamation/deformity noted Skin: grossly intact  LABS:  BMET  Recent Labs Lab 2016/02/12 0411  NA 139  K 4.2  CL 109  CO2 22  BUN 85*  CREATININE 3.22*  GLUCOSE 265*    Electrolytes  Recent Labs Lab 12-Feb-2016 0411  CALCIUM 9.3    CBC  Recent Labs Lab Feb 12, 2016 0411  WBC 14.5*  HGB 12.1  HCT 36.0  PLT 187    Coag's No results for input(s): APTT, INR in the last 168 hours.  Sepsis Markers  Recent Labs Lab February 12, 2016 0412  LATICACIDVEN 2.4*    ABG  Recent Labs Lab 02/12/16 0441  PHART 7.16*  PCO2ART 59*  PO2ART 88    Liver Enzymes  Recent Labs Lab 02-12-16 0411  AST 30  ALT 20  ALKPHOS 83  BILITOT 0.5  ALBUMIN 3.7    Cardiac Enzymes  Recent Labs Lab Feb 12, 2016 0411  TROPONINI 0.04*    Glucose No results for input(s): GLUCAP in the last 168 hours.  Imaging Ct Head Wo Contrast  Result Date: 2016-02-12  CLINICAL DATA:  Fall, unresponsive. Possible aspiration. Unresponsive. Code stroke. EXAM: CT HEAD WITHOUT CONTRAST CT CERVICAL SPINE WITHOUT CONTRAST TECHNIQUE: Multidetector CT imaging of the head and cervical spine was performed following the standard protocol without intravenous contrast. Multiplanar CT image reconstructions of the cervical spine were also generated. COMPARISON:  MRI head Aug 29, 2015 FINDINGS: CT HEAD FINDINGS BRAIN: LEFT frontal 5.3 x 8.5 x 6.3 cm (transverse by AP by CC) dense intraparenchymal hemorrhage with surrounding low-density vasogenic edema. 2 cm LEFT-to-RIGHT midline shift with LEFT ventricular effacement, RIGHT ventricular entrapment. Basal cistern effacement. No  acute large vascular territory infarct. Dural venous congestion versus small LEFT cerebellar tentorium subdural hematoma. VASCULAR: Severe calcific atherosclerosis of the carotid siphons. SKULL: No skull fracture. No significant scalp soft tissue swelling. SINUSES/ORBITS: Mild paranasal sinus mucosal thickening. The included ocular globes and orbital contents are non-suspicious. OTHER: Life-support lines in place. CT CERVICAL SPINE FINDINGS ALIGNMENT: Minimal grade 1 C3-4 anterolisthesis. Broad reversed cervical lordosis. SKULL BASE AND VERTEBRAE: Cervical vertebral bodies and posterior elements are intact. Severe C5-6 and C6-7 disc height loss, endplate spurring compatible with degenerative discs. Mild widening of the RIGHT C3-4 facet with articular regularity most consistent with inflammatory changes. No destructive bony lesions. C1-2 articulation maintained with moderate arthropathy. SOFT TISSUES AND SPINAL CANAL: Life-support lines in place. Secretions in the nasopharynx. DISC LEVELS: No significant osseous canal stenosis. Moderate RIGHT C3-4 neural foraminal narrowing. UPPER CHEST: At least four 3-4 mm RIGHT lung apical ground-glass opacities. OTHER: None. IMPRESSION: CT HEAD: Acute large LEFT frontal intraparenchymal hematoma resulting in 2 cm of LEFT-to-RIGHT subfalcine herniation. RIGHT ventricular entrapment. Small LEFT cerebellar tentorium subdural hematoma versus dural venous sinus congestion. CT CERVICAL SPINE: No acute fracture. Grade 1 C3-4 anterolisthesis on degenerative basis. Acute findings discussed with and reconfirmed by Dr.ALLISON WEBSTER on 2016/02/19 at 4:40 am. Electronically Signed   By: Awilda Metro M.D.   On: 02/19/16 04:43   Ct Cervical Spine Wo Contrast  Result Date: Feb 19, 2016 CLINICAL DATA:  Fall, unresponsive. Possible aspiration. Unresponsive. Code stroke. EXAM: CT HEAD WITHOUT CONTRAST CT CERVICAL SPINE WITHOUT CONTRAST TECHNIQUE: Multidetector CT imaging of the head and  cervical spine was performed following the standard protocol without intravenous contrast. Multiplanar CT image reconstructions of the cervical spine were also generated. COMPARISON:  MRI head Aug 29, 2015 FINDINGS: CT HEAD FINDINGS BRAIN: LEFT frontal 5.3 x 8.5 x 6.3 cm (transverse by AP by CC) dense intraparenchymal hemorrhage with surrounding low-density vasogenic edema. 2 cm LEFT-to-RIGHT midline shift with LEFT ventricular effacement, RIGHT ventricular entrapment. Basal cistern effacement. No acute large vascular territory infarct. Dural venous congestion versus small LEFT cerebellar tentorium subdural hematoma. VASCULAR: Severe calcific atherosclerosis of the carotid siphons. SKULL: No skull fracture. No significant scalp soft tissue swelling. SINUSES/ORBITS: Mild paranasal sinus mucosal thickening. The included ocular globes and orbital contents are non-suspicious. OTHER: Life-support lines in place. CT CERVICAL SPINE FINDINGS ALIGNMENT: Minimal grade 1 C3-4 anterolisthesis. Broad reversed cervical lordosis. SKULL BASE AND VERTEBRAE: Cervical vertebral bodies and posterior elements are intact. Severe C5-6 and C6-7 disc height loss, endplate spurring compatible with degenerative discs. Mild widening of the RIGHT C3-4 facet with articular regularity most consistent with inflammatory changes. No destructive bony lesions. C1-2 articulation maintained with moderate arthropathy. SOFT TISSUES AND SPINAL CANAL: Life-support lines in place. Secretions in the nasopharynx. DISC LEVELS: No significant osseous canal stenosis. Moderate RIGHT C3-4 neural foraminal narrowing. UPPER CHEST: At least four 3-4 mm RIGHT lung apical ground-glass opacities. OTHER: None. IMPRESSION: CT HEAD:  Acute large LEFT frontal intraparenchymal hematoma resulting in 2 cm of LEFT-to-RIGHT subfalcine herniation. RIGHT ventricular entrapment. Small LEFT cerebellar tentorium subdural hematoma versus dural venous sinus congestion. CT CERVICAL SPINE:  No acute fracture. Grade 1 C3-4 anterolisthesis on degenerative basis. Acute findings discussed with and reconfirmed by Dr.ALLISON WEBSTER on 02/18/2016 at 4:40 am. Electronically Signed   By: Awilda Metroourtnay  Bloomer M.D.   On: 11-16-15 04:43   Dg Chest Portable 1 View  Result Date: 01/29/2016 CLINICAL DATA:  Intubation. EXAM: PORTABLE CHEST 1 VIEW COMPARISON:  03/31/2015 FINDINGS: An endotracheal tube is been placed with tip measuring 2.5 cm above the carina. Normal heart size and pulmonary vascularity. Emphysematous changes and scattered fibrosis in the lungs. No focal airspace disease or consolidation. No blunting of costophrenic angles. No pneumothorax. Calcified and tortuous aorta. Degenerative changes in the shoulders. IMPRESSION: Endotracheal tube tip measures 2.5 cm above the carina. Emphysematous changes in the lungs. No evidence of active pulmonary disease. Electronically Signed   By: Burman NievesWilliam  Stevens M.D.   On: 11-16-15 04:33     STUDIES:  10/4 CT head>>CT HEAD: Acute large LEFT frontal intraparenchymal hematomaresulting in 2 cm of LEFT-to-RIGHT subfalcine herniation. RIGHTventricular entrapment.   CULTURES: none  ANTIBIOTICS: none  SIGNIFICANT EVENTS: 10/4 >patient admitted with non survival brain bleed. Awaiting family members.  LINES/TUBES: 10/4 et tube>>    A: -Acute respiratory failure r/t ICH -Large left frontal interparenchymal hematoma resulting in 2cm of left  To right subfalcine herniation.     P -Continue MV -Family made aware of severity of her problem, awaiting for son -Will place comfort care orders  -Prn fentanyl and versed -Maintain SBP  160-180   FAMILY  - Updates: Husband at the bedside and is aware of the patient's condition. Awaiting Son to come to place comfort care orders.      Elieser Tetrick,AG-ACNP Pulmonary and Critical Care Medicine [facility]   02/03/2016, 5:45 AM

## 2016-02-27 DEATH — deceased

## 2018-07-05 IMAGING — CT CT CERVICAL SPINE W/O CM
3 of 7 series · 13 of 33 positions shown, 15 images · non-contrast
Comparison: MRI head August 29, 2015

CLINICAL DATA: Fall, unresponsive. Possible aspiration.
Unresponsive. Code stroke.

EXAM:
CT HEAD WITHOUT CONTRAST
CT CERVICAL SPINE WITHOUT CONTRAST
TECHNIQUE: Multidetector CT imaging of the head and cervical spine was
performed following the standard protocol without intravenous
contrast. Multiplanar CT image reconstructions of the cervical spine
were also generated.

[Series 6: coronal soft tissue · coronal · 0.27mm/px · 3 of 61 slices shown]
[im 16/61  bone]
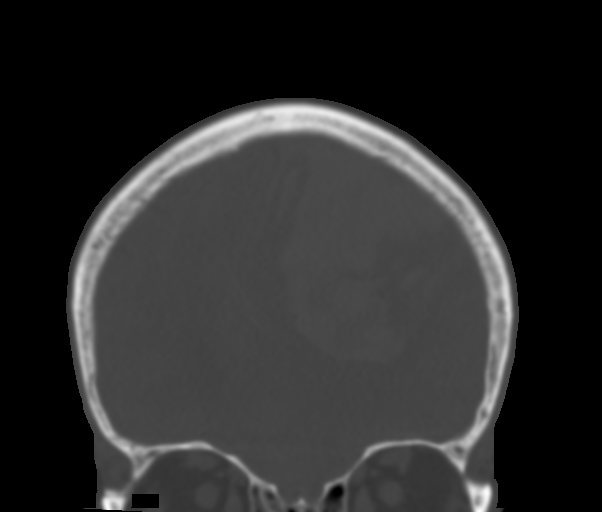
[im 31/61  bone]
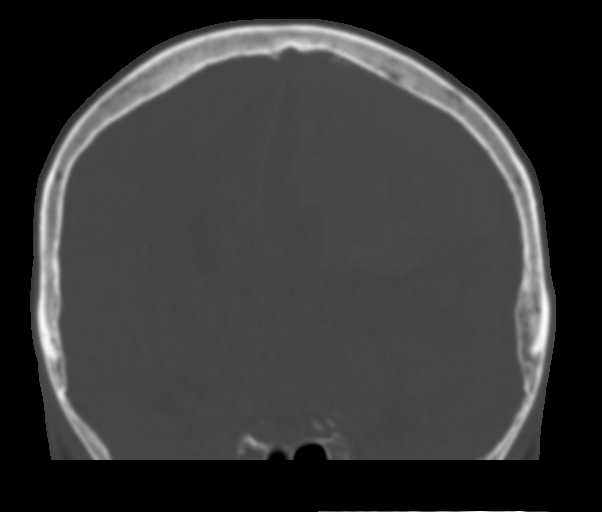
[im 46/61  bone]
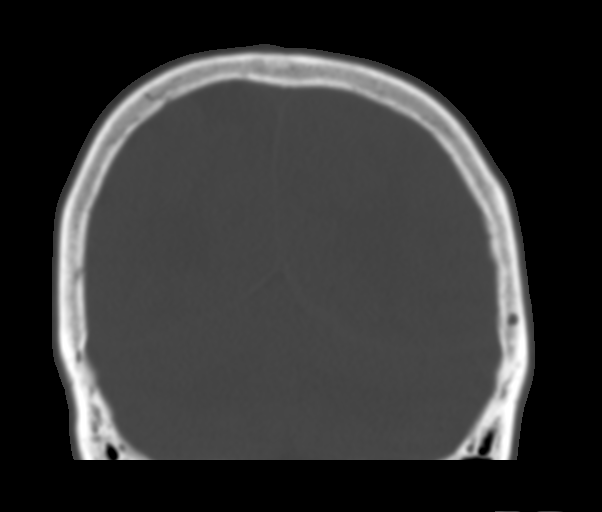

[Series 11: sagittal bone · sagittal · 0.31mm/px · 5 of 56 slices shown]
[im 10/56  bone]
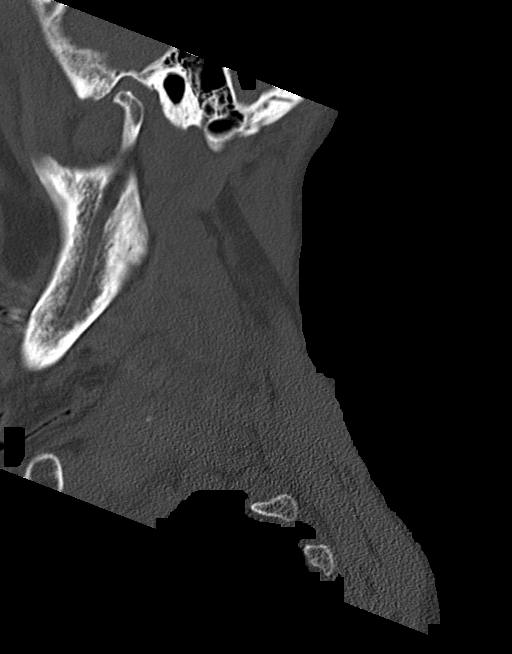
[im 19/56  bone]
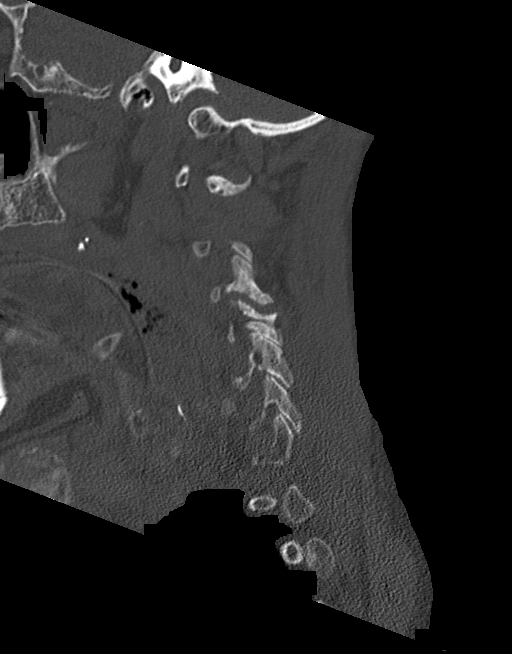
[im 28/56  bone]
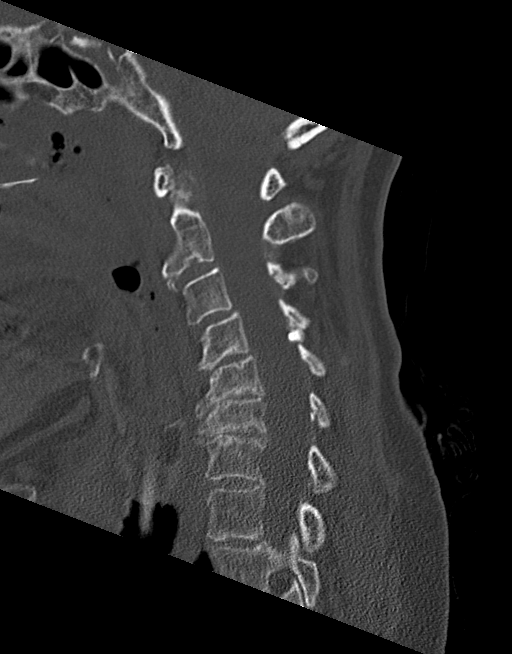
[im 37/56  bone]
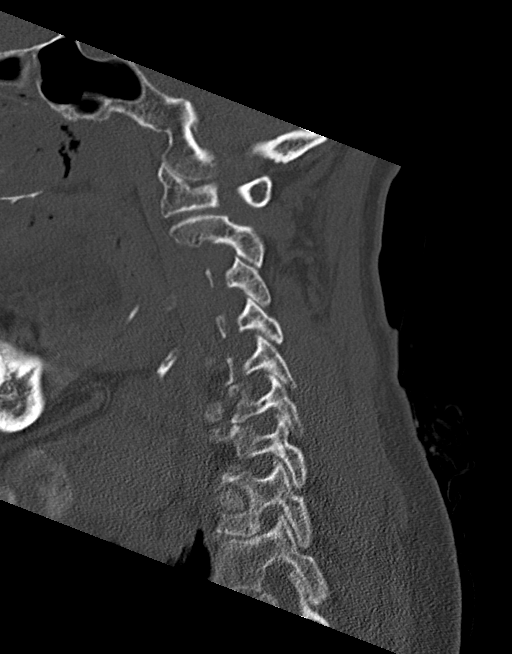
[im 46/56  bone]
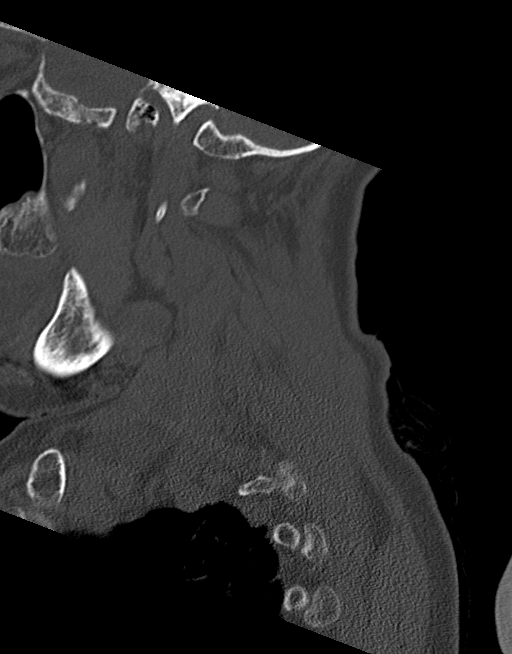

[Series 13: orthogonal bone · axial · 0.25mm/px · z∈[-217,-114]mm · 5 of 86 slices shown, 7 images]
[im 15/86  soft-tissue]
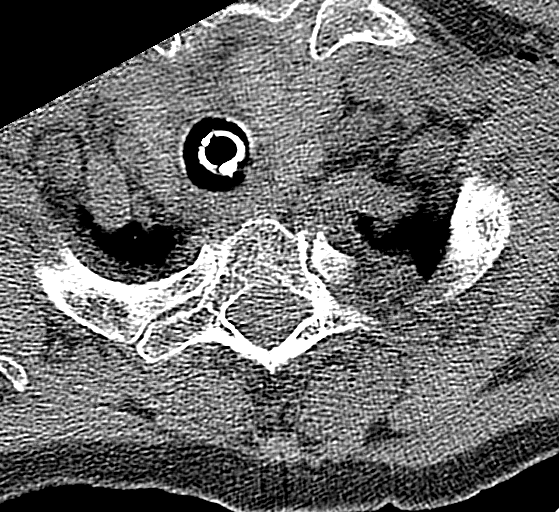
[im 15/86  bone]
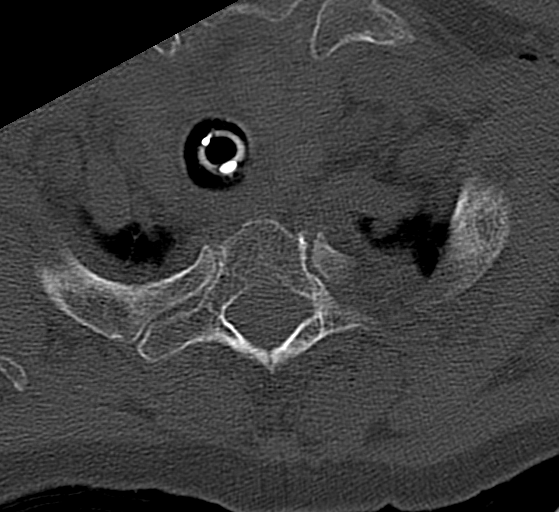
[im 29/86  bone]
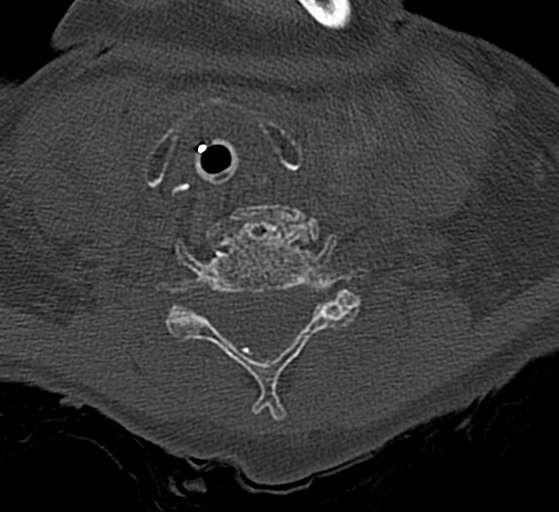
[im 43/86  bone]
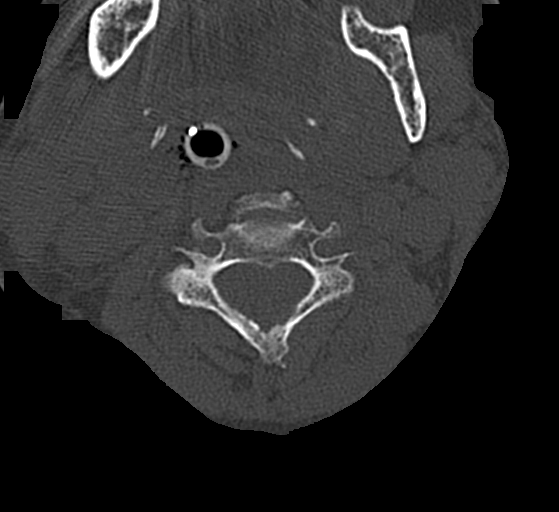
[im 57/86  bone]
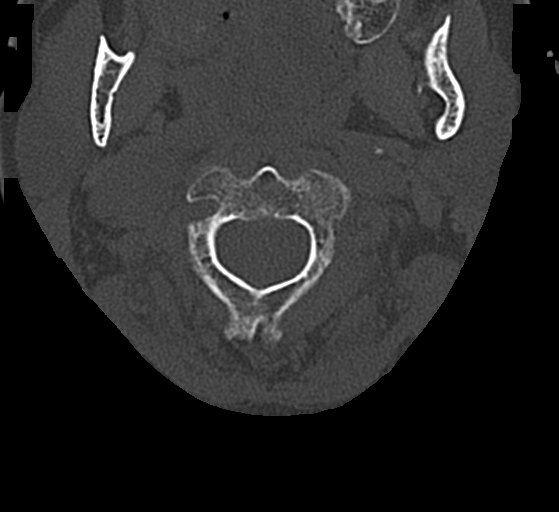
[im 71/86  soft-tissue]
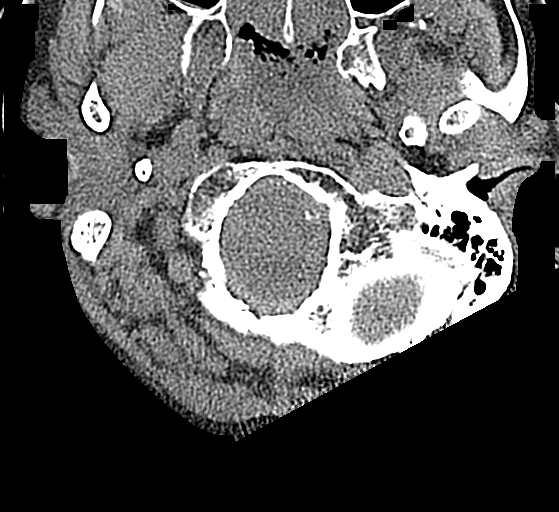
[im 71/86  bone]
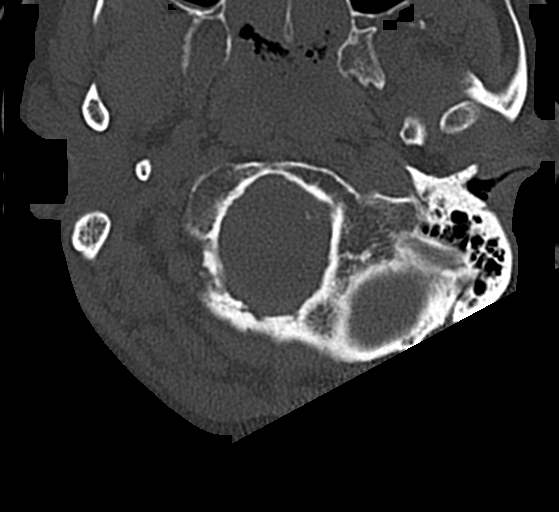

[13 of 33 positions shown; findings below may reference images not displayed]

FINDINGS: CT HEAD FINDINGS

BRAIN: LEFT frontal 5.3 x 8.5 x 6.3 cm (transverse by AP by CC)
dense intraparenchymal hemorrhage with surrounding low-density
vasogenic edema. 2 cm LEFT-to-RIGHT midline shift with LEFT
ventricular effacement, RIGHT ventricular entrapment. Basal cistern
effacement. No acute large vascular territory infarct. Dural venous
congestion versus small LEFT cerebellar tentorium subdural hematoma.

VASCULAR: Severe calcific atherosclerosis of the carotid siphons.

SKULL: No skull fracture. No significant scalp soft tissue swelling.

SINUSES/ORBITS: Mild paranasal sinus mucosal thickening. The
included ocular globes and orbital contents are non-suspicious.

OTHER: Life-support lines in place.

CT CERVICAL SPINE FINDINGS

ALIGNMENT: Minimal grade 1 C3-4 anterolisthesis. Broad reversed
cervical lordosis.

SKULL BASE AND VERTEBRAE: Cervical vertebral bodies and posterior
elements are intact. Severe C5-6 and C6-7 disc height loss, endplate
spurring compatible with degenerative discs. Mild widening of the
RIGHT C3-4 facet with articular regularity most consistent with
inflammatory changes. No destructive bony lesions. C1-2 articulation
maintained with moderate arthropathy.

SOFT TISSUES AND SPINAL CANAL: Life-support lines in place.
Secretions in the nasopharynx.

DISC LEVELS: No significant osseous canal stenosis. Moderate RIGHT
C3-4 neural foraminal narrowing.

UPPER CHEST: At least four 3-4 mm RIGHT lung apical ground-glass
opacities.

OTHER: None.
IMPRESSION: CT HEAD: Acute large LEFT frontal intraparenchymal hematoma
resulting in 2 cm of LEFT-to-RIGHT subfalcine herniation. RIGHT
ventricular entrapment.

Small LEFT cerebellar tentorium subdural hematoma versus dural
venous sinus congestion.

CT CERVICAL SPINE: No acute fracture. Grade 1 C3-4 anterolisthesis
on degenerative basis.

Acute findings discussed with and reconfirmed by Dr.NIYAH CHEA
on 01/30/2016 at [DATE].
